# Patient Record
Sex: Female | Born: 1995 | Race: Black or African American | Hispanic: No | Marital: Single | Smoking: Never smoker
Health system: Southern US, Community
[De-identification: ages and names within clinical notes are randomized; demographics above are authoritative.]

## PROBLEM LIST (undated history)

## (undated) DIAGNOSIS — Z789 Other specified health status: Secondary | ICD-10-CM

## (undated) HISTORY — PX: NO PAST SURGERIES: SHX2092

---

## 2019-08-10 ENCOUNTER — Encounter (HOSPITAL_COMMUNITY): Payer: Self-pay | Admitting: Student

## 2019-08-10 ENCOUNTER — Emergency Department (HOSPITAL_COMMUNITY)
Admission: EM | Admit: 2019-08-10 | Discharge: 2019-08-10 | Disposition: A | Discharge status: Home / Self Care | Payer: PRIVATE HEALTH INSURANCE | Attending: Emergency Medicine | Admitting: Emergency Medicine

## 2019-08-10 ENCOUNTER — Emergency Department (HOSPITAL_COMMUNITY): Payer: PRIVATE HEALTH INSURANCE

## 2019-08-10 ENCOUNTER — Other Ambulatory Visit: Payer: Self-pay

## 2019-08-10 DIAGNOSIS — R109 Unspecified abdominal pain: Secondary | ICD-10-CM

## 2019-08-10 DIAGNOSIS — R102 Pelvic and perineal pain: Secondary | ICD-10-CM | POA: Insufficient documentation

## 2019-08-10 DIAGNOSIS — N899 Noninflammatory disorder of vagina, unspecified: Secondary | ICD-10-CM | POA: Insufficient documentation

## 2019-08-10 DIAGNOSIS — R197 Diarrhea, unspecified: Secondary | ICD-10-CM | POA: Diagnosis present

## 2019-08-10 DIAGNOSIS — Z3201 Encounter for pregnancy test, result positive: Secondary | ICD-10-CM | POA: Diagnosis not present

## 2019-08-10 DIAGNOSIS — Z3490 Encounter for supervision of normal pregnancy, unspecified, unspecified trimester: Secondary | ICD-10-CM

## 2019-08-10 LAB — COMPREHENSIVE METABOLIC PANEL
ALT: 12 U/L (ref 0–44)
AST: 18 U/L (ref 15–41)
Albumin: 4.2 g/dL (ref 3.5–5.0)
Alkaline Phosphatase: 64 U/L (ref 38–126)
Anion gap: 6 (ref 5–15)
BUN: 7 mg/dL (ref 6–20)
CO2: 27 mmol/L (ref 22–32)
Calcium: 8.9 mg/dL (ref 8.9–10.3)
Chloride: 104 mmol/L (ref 98–111)
Creatinine, Ser: 0.87 mg/dL (ref 0.44–1.00)
GFR calc Af Amer: >60 mL/min (ref >60)
GFR calc non Af Amer: >60 mL/min (ref >60)
Glucose, Bld: 123 mg/dL — ABNORMAL HIGH (ref 70–99)
Potassium: 3.9 mmol/L (ref 3.5–5.1)
Sodium: 137 mmol/L (ref 135–145)
Total Bilirubin: 0.9 mg/dL (ref 0.3–1.2)
Total Protein: 7.2 g/dL (ref 6.5–8.1)

## 2019-08-10 LAB — WET PREP, GENITAL
Clue Cells Wet Prep HPF POC: NONE SEEN
Sperm: NONE SEEN
Trich, Wet Prep: NONE SEEN

## 2019-08-10 LAB — URINALYSIS, ROUTINE W REFLEX MICROSCOPIC
Bilirubin Urine: NEGATIVE
Glucose, UA: NEGATIVE mg/dL
Hgb urine dipstick: NEGATIVE
Ketones, ur: NEGATIVE mg/dL
Nitrite: NEGATIVE
Protein, ur: NEGATIVE mg/dL
Specific Gravity, Urine: 1.016 (ref 1.005–1.030)
pH: 7 (ref 5.0–8.0)

## 2019-08-10 LAB — CBC
HCT: 35.6 % — ABNORMAL LOW (ref 36.0–46.0)
Hemoglobin: 11.1 g/dL — ABNORMAL LOW (ref 12.0–15.0)
MCH: 29.3 pg (ref 26.0–34.0)
MCHC: 31.2 g/dL (ref 30.0–36.0)
MCV: 93.9 fL (ref 80.0–100.0)
Platelets: 226 10*3/uL (ref 150–400)
RBC: 3.79 MIL/uL — ABNORMAL LOW (ref 3.87–5.11)
RDW: 13.8 % (ref 11.5–15.5)
WBC: 8.6 10*3/uL (ref 4.0–10.5)
nRBC: 0 % (ref 0.0–0.2)

## 2019-08-10 LAB — I-STAT BETA HCG BLOOD, ED (MC, WL, AP ONLY): I-stat hCG, quantitative: >2000 m[IU]/mL — ABNORMAL HIGH (ref <5)

## 2019-08-10 LAB — LIPASE, BLOOD: Lipase: 23 U/L (ref 11–51)

## 2019-08-10 LAB — HCG, QUANTITATIVE, PREGNANCY: hCG, Beta Chain, Quant, S: 4608 m[IU]/mL — ABNORMAL HIGH (ref <5)

## 2019-08-10 MED ORDER — CLOTRIMAZOLE 1 % VA CREA: 1.0000 | TOPICAL_CREAM | Freq: Every day | VAGINAL | 0 refills | Status: AC

## 2019-08-10 MED ORDER — SODIUM CHLORIDE 0.9% FLUSH: 3.0000 mL | Freq: Once | INTRAVENOUS | Status: DC

## 2019-08-10 NOTE — ED Triage Notes (Signed)
Pt here from home with c/o right lower abd pain along with diarrhea , pt states that it is usually worse after spicy foods ,

## 2019-08-10 NOTE — ED Provider Notes (Signed)
[facility]   CSN: [id number] Arrival date & time: 08/10/19  1033     History   Chief Complaint No chief complaint on file.   HPI Kendra Middleton is a 23 y.o. female with no significant past medical history who presents to the ED for an evaluation of diarrhea and abdominal cramping for the past week.  Patient admits to 2-3 episodes of non-bloody, watery diarrhea daily for the past week.  Patient notes diarrhea started 1 week ago after [facility] a [facility]. She is unsure when her last menstrual period was. She admits to irregular menses. Denies urination symptoms. Denies vaginal symptoms. She is sexually active with 1 partner without protection. Patient is not currently on any birth control. She is not concerned for any STIs at this time. Patient also notes her nipples have been sore for the past week. Patient denies nausea, vomiting, chest pain, shortness of breath, vaginal bleeding, and dysuria.   History reviewed. No pertinent past medical history.  There are no active problems to display for this patient.   History reviewed. No pertinent surgical history.   OB History   No obstetric history on file.      Home Medications    Prior to Admission medications   Medication Sig Start Date End Date Taking? Authorizing Provider  clotrimazole (GYNE-LOTRIMIN) 1 % vaginal cream Place 1 Applicatorful vaginally at bedtime for 7 days. 08/10/19 08/17/19  Renee Harderheek, Rhian Funari B, PA-C    Family History History reviewed. No pertinent family history.  Social History Social History   Tobacco Use  . Smoking status: Not on file  Substance Use Topics  . Alcohol use: Not on file  . Drug use: Not on file     Allergies   Patient has no known allergies.   Review of Systems Review of Systems  Constitutional: Negative for chills and fever.  Gastrointestinal: Positive for abdominal pain and diarrhea. Negative for nausea and vomiting.   Genitourinary: Positive for menstrual problem (irregular menstrual periods) and pelvic pain (cramping). Negative for difficulty urinating, dysuria, vaginal bleeding, vaginal discharge and vaginal pain.     Physical Exam Updated Vital Signs BP 136/67 (BP Location: Right Arm)   Pulse 91   Temp 99.1 F (37.3 C) (Oral)   Resp 18   SpO2 100%   Physical Exam Vitals signs and nursing note reviewed. Exam conducted with a chaperone present.  Constitutional:      General: She is not in acute distress.    Appearance: She is not ill-appearing.  HENT:     Head: Normocephalic.  Eyes:     Conjunctiva/sclera: Conjunctivae normal.  Neck:     Musculoskeletal: Normal range of motion and neck supple.  Cardiovascular:     Rate and Rhythm: Normal rate and regular rhythm.     Pulses: Normal pulses.     Heart sounds: Normal heart sounds. No murmur. No friction rub. No gallop.   Pulmonary:     Effort: Pulmonary effort is normal.     Breath sounds: Normal breath sounds.  Abdominal:     General: Abdomen is flat. Bowel sounds are normal. There is no distension.     Palpations: Abdomen is soft.     Tenderness: There is no abdominal tenderness. There is no guarding or rebound.     Comments: Abdomen soft, nondistended, nontender to palpation in all quadrants without guarding or peritoneal signs. No rebound.    Genitourinary:    Exam position: Supine.  Vagina: Vaginal discharge (white discharge) present. No tenderness or bleeding.     Cervix: Discharge present. No cervical motion tenderness, erythema or cervical bleeding.     Uterus: Normal.      Adnexa: Right adnexa normal and left adnexa normal.       Right: No tenderness.         Left: No tenderness.    Musculoskeletal:     Comments: Able to move all 4 extremities without difficulty. No lower extremity edema. Distal sensation and pulses intact bilaterally.  Lymphadenopathy:     Lower Body: No right inguinal adenopathy. No left inguinal  adenopathy.  Skin:    General: Skin is warm and dry.  Neurological:     General: No focal deficit present.     Mental Status: She is alert.      ED Treatments / Results  Labs (all labs ordered are listed, but only abnormal results are displayed) Labs Reviewed  WET PREP, GENITAL - Abnormal; Notable for the following components:      Result Value   Yeast Wet Prep HPF POC PRESENT (*)    WBC, Wet Prep HPF POC MODERATE (*)    All other components within normal limits  COMPREHENSIVE METABOLIC PANEL - Abnormal; Notable for the following components:   Glucose, Bld 123 (*)    All other components within normal limits  CBC - Abnormal; Notable for the following components:   RBC 3.79 (*)    Hemoglobin 11.1 (*)    HCT 35.6 (*)    All other components within normal limits  URINALYSIS, ROUTINE W REFLEX MICROSCOPIC - Abnormal; Notable for the following components:   APPearance HAZY (*)    Leukocytes,Ua SMALL (*)    Bacteria, UA RARE (*)    All other components within normal limits  HCG, QUANTITATIVE, PREGNANCY - Abnormal; Notable for the following components:   hCG, Beta Chain, Quant, S 4,608 (*)    All other components within normal limits  I-STAT BETA HCG BLOOD, ED ([facility], [facility], [facility] ONLY) - Abnormal; Notable for the following components:   I-stat hCG, quantitative >2,000.0 (*)    All other components within normal limits  LIPASE, BLOOD  GC/CHLAMYDIA PROBE AMP ([place]) NOT AT [facility]    EKG None  Radiology US Ob Less Than 14 Weeks With Ob Transvaginal  Result Date: 08/10/2019 CLINICAL DATA:  Abdominal cramping with positive beta HCG EXAM: OBSTETRIC <14 WK [place] AND TRANSVAGINAL OB US TECHNIQUE: Both transabdominal and transvaginal ultrasound examinations were performed for complete evaluation of the gestation as well as the maternal uterus, adnexal regions, and pelvic cul-de-sac. Transvaginal technique was performed to assess early pregnancy. COMPARISON:  None. FINDINGS: Intrauterine  gestational sac: Visualized Yolk sac:  Not visualized Embryo:  Not visualized Cardiac Activity: Not visualized MSD: 8 mm   5 w   4 d Subchorionic hemorrhage:  None visualized. Maternal uterus/adnexae: There is an apparent hemorrhagic corpus luteum on the right, measuring 2.1 x 1.8 x 1.7 cm. No other extrauterine pelvic mass. A small amount of free fluid is noted in the cul-de-sac region. IMPRESSION: 1. Probable early intrauterine gestational sac, but no yolk sac, fetal pole, or cardiac activity yet visualized. Recommend follow-up quantitative B-HCG levels and follow-up US in 14 days to assess viability. This recommendation follows SRU consensus guidelines: Diagnostic Criteria for Nonviable Pregnancy Early in the First Trimester. [employer] Med 2013; 409:7353-29. Based on apparent gestational sac size, estimated gestational age is 5+ weeks. 2. Apparent hemorrhagic corpus  luteum on the right. Particular attention this area on subsequent examinations advised. 3. Small amount of free pelvic fluid which may be due to recent leakage from corpus luteum. Electronically Signed   By: Bretta Bang III M.D.   On: 08/10/2019 14:13    Procedures Procedures (including critical care time)  Medications Ordered in ED Medications  sodium chloride flush (NS) 0.9 % injection 3 mL (has no administration in time range)     Initial Impression / Assessment and Plan / ED Course  I have reviewed the triage vital signs and the nursing notes.  Pertinent labs & imaging results that were available during my care of the patient were reviewed by me and considered in my medical decision making (see chart for details).  Clinical Course as of Aug 10 1703  Tue Aug 10, 2019  1439 Spoke to on call OBGYN about the hemorrhagic corpus luteum who notes it can cause pain, but no emergent treatment needed. She recommends repeat quant in 48 hours. If quant is rising appropriately, repeat US in 14 days. If it is not rising appropriately,  repeat US then   [CC]    Clinical Course User Index [CC] Cheek, Vesta Mixer, PA-C      23 year old female presents to the ED for an evaluation of diarrhea, abdominal cramps and nipple irritation for the past week.  Patient is unsure when her last menstrual period was due to irregular menses.  Patient is afebrile, not tachycardic or hypoxic.  Patient is in no acute distress and rather well-appearing.  Abdomen soft, nondistended, nontender.  No peritoneal signs. Low suspicion for appendicitis, cholecystitis, bowel obstruction or perforation at this time. No CVA tenderness bilaterally.  Initial i-STAT beta hcG >2,000 will order quantitative hcg.  UA reassuring with small amounts of leukocytes but rare bacteria likely due to contamination. CBC with no leukocytosis. Hgb at 11.1, no previous records to compare baseline. CMP reassuring. Suspect patient's symptoms are related to pregnancy. Will obtain US to rule out ectopic and to ensure it is a intrauterine pregnancy.   [place] personally reviewed which shows possible early intrauterine pregnancy, but too early to tell. Spoke to [employer]- see note above. Patient notes after transvaginal US, she noticed yellow discharge. Will perform pelvic with GC/Ch and wet prep. No CMT. No concern for PID. Wet prep positive for yeast. Will treat with topical cream given patient is pregnant. Advised patient to have repeat hcg in 48 hours and repeat US in 14 days if hcg continues to rise as expected. Patient has been advised to start a prenatal vitamin and call an OBGYN to schedule an appointment once she returns home. Patient does not want something for diarrhea at this time due to her positive pregnancy status. Strict ED precautions discussed with patient. Patient states understanding and agrees to plan. Patient discharged home in no acute distress and vitals within normal limits.   Final Clinical Impressions(s) / ED Diagnoses   Final diagnoses:  Pregnancy at early stage   Diarrhea, unspecified type    ED Discharge Orders         Ordered    clotrimazole (GYNE-LOTRIMIN) 1 % vaginal cream  Daily at bedtime     08/10/19 [address] 08/10/19 1704    Jacalyn Lefevre, MD 08/11/19 [phone]

## 2019-08-10 NOTE — ED Notes (Signed)
Patient transported to Ultrasound 

## 2019-08-10 NOTE — ED Notes (Signed)
Discharge instructions and prescription discussed with Pt. Pt verbalized understanding. Pt stable and ambulatory.    

## 2019-08-10 NOTE — ED Notes (Signed)
Spoke with main lab and they are processing the HCG quantitative.

## 2019-08-10 NOTE — Discharge Instructions (Addendum)
As discussed, you will need a repeat quantitative hcg in 48. Your value today was 4608. If your hcg raises as expected in 48 hours you do not need another ultrasound for 14 days. If your level does not rise as it is suppose to get a repeat ultrasound. I am prescribing you a vaginal cream for your yeast infection. Take as prescribed. Prenatal vitamins are over the counter, so you can just grab one once you fill your prescription. Please call to schedule an appointment with an OBGYN within the next week for further evaluation. Return to the ER for new or worsening symptoms.

## 2019-08-12 ENCOUNTER — Emergency Department (HOSPITAL_COMMUNITY)
Admission: EM | Admit: 2019-08-12 | Discharge: 2019-08-12 | Disposition: A | Discharge status: Home / Self Care | Payer: PRIVATE HEALTH INSURANCE | Attending: Emergency Medicine | Admitting: Emergency Medicine

## 2019-08-12 DIAGNOSIS — O209 Hemorrhage in early pregnancy, unspecified: Secondary | ICD-10-CM | POA: Insufficient documentation

## 2019-08-12 DIAGNOSIS — Z3A00 Weeks of gestation of pregnancy not specified: Secondary | ICD-10-CM | POA: Diagnosis not present

## 2019-08-12 DIAGNOSIS — Z3A01 Less than 8 weeks gestation of pregnancy: Secondary | ICD-10-CM

## 2019-08-12 LAB — HCG, QUANTITATIVE, PREGNANCY: hCG, Beta Chain, Quant, S: 8701 m[IU]/mL — ABNORMAL HIGH (ref <5)

## 2019-08-12 LAB — I-STAT BETA HCG BLOOD, ED (MC, WL, AP ONLY): I-stat hCG, quantitative: >2000 m[IU]/mL — ABNORMAL HIGH (ref <5)

## 2019-08-12 NOTE — ED Notes (Signed)
ED Provider at bedside. 

## 2019-08-12 NOTE — Discharge Instructions (Addendum)
Your blood test shows your pregnancy is progressing.   Schedule appointment for prenatal care.  Your current hormone level is 8701.

## 2019-08-12 NOTE — ED Triage Notes (Signed)
Pt states she has arrived here for a repeat hcg as instructed by the doctor  to return in 48 hours for a repeat hcg.

## 2019-08-12 NOTE — ED Notes (Signed)
Pt asked whether she would be getting results soon, verified with lab that results are in process, updated pt

## 2019-08-12 NOTE — ED Provider Notes (Signed)
[facility] EMERGENCY DEPARTMENT Provider Note   CSN: [id number] Arrival date & time: 08/12/19  1006     History   Chief Complaint Chief Complaint  Patient presents with   Possible Pregnancy    HPI Kendra Middleton is a 23 y.o. female.     Pt was seen here 2 days ago for early pregnancy and spotting.  Patient was told to return for repeat quantitative hCG patient had an ultrasound which showed a gestational sac.  Patient reports that she has had a small amount of pink spotting.  Patient denies any bleeding she has not had any cramping she denies any pain  The history is provided by the patient. No language interpreter was used.  Possible Pregnancy This is a new problem. Nothing aggravates the symptoms. Nothing relieves the symptoms. She has tried nothing for the symptoms. The treatment provided no relief.    No past medical history on file.  There are no active problems to display for this patient.   No past surgical history on file.   OB History   No obstetric history on file.      Home Medications    Prior to Admission medications   Medication Sig Start Date End Date Taking? Authorizing Provider  clotrimazole (GYNE-LOTRIMIN) 1 % vaginal cream Place 1 Applicatorful vaginally at bedtime for 7 days. 08/10/19 08/17/19  Jonette Eva, PA-C    Family History No family history on file.  Social History Social History   Tobacco Use   Smoking status: Not on file  Substance Use Topics   Alcohol use: Not on file   Drug use: Not on file     Allergies   Patient has no known allergies.   Review of Systems Review of Systems  All other systems reviewed and are negative.    Physical Exam Updated Vital Signs BP (!) 127/93    Pulse 82    Temp 98 F (36.7 C) (Oral)    Resp 16    SpO2 99%   Physical Exam Vitals signs and nursing note reviewed.  Constitutional:      Appearance: Normal appearance.  Neck:     Musculoskeletal: Normal  range of motion.  Cardiovascular:     Rate and Rhythm: Normal rate.     Pulses: Normal pulses.  Pulmonary:     Effort: Pulmonary effort is normal.  Abdominal:     General: Abdomen is flat.  Musculoskeletal: Normal range of motion.  Skin:    General: Skin is warm.  Neurological:     General: No focal deficit present.     Mental Status: She is alert.  Psychiatric:        Mood and Affect: Mood normal.      ED Treatments / Results  Labs (all labs ordered are listed, but only abnormal results are displayed) Labs Reviewed  HCG, QUANTITATIVE, PREGNANCY - Abnormal; Notable for the following components:      Result Value   hCG, Beta Chain, Quant, S 8,701 (*)    All other components within normal limits  I-STAT BETA HCG BLOOD, ED ([facility], [facility], [facility] ONLY) - Abnormal; Notable for the following components:   I-stat hCG, quantitative >2,000.0 (*)    All other components within normal limits    EKG None  Radiology US Ob Less Than 14 Weeks With Ob Transvaginal  Result Date: 08/10/2019 CLINICAL DATA:  Abdominal cramping with positive beta HCG EXAM: OBSTETRIC <14 WK [place] AND TRANSVAGINAL OB US TECHNIQUE: Both  transabdominal and transvaginal ultrasound examinations were performed for complete evaluation of the gestation as well as the maternal uterus, adnexal regions, and pelvic cul-de-sac. Transvaginal technique was performed to assess early pregnancy. COMPARISON:  None. FINDINGS: Intrauterine gestational sac: Visualized Yolk sac:  Not visualized Embryo:  Not visualized Cardiac Activity: Not visualized MSD: 8 mm   5 w   4 d Subchorionic hemorrhage:  None visualized. Maternal uterus/adnexae: There is an apparent hemorrhagic corpus luteum on the right, measuring 2.1 x 1.8 x 1.7 cm. No other extrauterine pelvic mass. A small amount of free fluid is noted in the cul-de-sac region. IMPRESSION: 1. Probable early intrauterine gestational sac, but no yolk sac, fetal pole, or cardiac activity yet visualized.  Recommend follow-up quantitative B-HCG levels and follow-up US in 14 days to assess viability. This recommendation follows SRU consensus guidelines: Diagnostic Criteria for Nonviable Pregnancy Early in the First Trimester. Malva Limes Med 2013; 109:3235-57. Based on apparent gestational sac size, estimated gestational age is 5+ weeks. 2. Apparent hemorrhagic corpus luteum on the right. Particular attention this area on subsequent examinations advised. 3. Small amount of free pelvic fluid which may be due to recent leakage from corpus luteum. Electronically Signed   By: Bretta Bang III M.D.   On: 08/10/2019 14:13    Procedures Procedures (including critical care time)  Medications Ordered in ED Medications - No data to display   Initial Impression / Assessment and Plan / ED Course  I have reviewed the triage vital signs and the nursing notes.  Pertinent labs & imaging results that were available during my care of the patient were reviewed by me and considered in my medical decision making (see chart for details).        Medical decision making: Ultrasound reviewed and discussed with patient I explained to patient that she is very early.  Patient's quantitative hCG has doubled in 48 hours.  Is consistent with a developing pregnancy.  Patient is advised to establish prenatal care she is advised to [facility] if any further complications. An After Visit Summary was printed and given to the patient.   Final Clinical Impressions(s) / ED Diagnoses   Final diagnoses:  Less than [redacted] weeks gestation of pregnancy    ED Discharge Orders    None       Osie Cheeks 08/12/19 1337    Gerhard Munch, MD 08/13/19 [phone]

## 2019-08-13 LAB — GC/CHLAMYDIA PROBE AMP (~~LOC~~) NOT AT ARMC
Chlamydia: NEGATIVE
Neisseria Gonorrhea: NEGATIVE

## 2019-08-19 ENCOUNTER — Encounter (HOSPITAL_COMMUNITY): Payer: Self-pay | Admitting: Student

## 2019-08-19 ENCOUNTER — Inpatient Hospital Stay (HOSPITAL_COMMUNITY)
Admission: AD | Admit: 2019-08-19 | Discharge: 2019-08-19 | Disposition: A | Discharge status: Home / Self Care | Payer: PRIVATE HEALTH INSURANCE | Attending: Obstetrics & Gynecology | Admitting: Obstetrics & Gynecology

## 2019-08-19 ENCOUNTER — Other Ambulatory Visit: Payer: Self-pay

## 2019-08-19 DIAGNOSIS — R109 Unspecified abdominal pain: Secondary | ICD-10-CM | POA: Insufficient documentation

## 2019-08-19 DIAGNOSIS — O209 Hemorrhage in early pregnancy, unspecified: Secondary | ICD-10-CM | POA: Diagnosis not present

## 2019-08-19 DIAGNOSIS — O26891 Other specified pregnancy related conditions, first trimester: Secondary | ICD-10-CM | POA: Insufficient documentation

## 2019-08-19 DIAGNOSIS — M549 Dorsalgia, unspecified: Secondary | ICD-10-CM | POA: Diagnosis not present

## 2019-08-19 DIAGNOSIS — Z3A01 Less than 8 weeks gestation of pregnancy: Secondary | ICD-10-CM

## 2019-08-19 DIAGNOSIS — N939 Abnormal uterine and vaginal bleeding, unspecified: Secondary | ICD-10-CM | POA: Diagnosis present

## 2019-08-19 DIAGNOSIS — O99891 Other specified diseases and conditions complicating pregnancy: Secondary | ICD-10-CM | POA: Diagnosis not present

## 2019-08-19 DIAGNOSIS — Z3A00 Weeks of gestation of pregnancy not specified: Secondary | ICD-10-CM | POA: Diagnosis not present

## 2019-08-19 HISTORY — DX: Other specified health status: Z78.9

## 2019-08-19 LAB — URINALYSIS, ROUTINE W REFLEX MICROSCOPIC
Bilirubin Urine: NEGATIVE
Glucose, UA: NEGATIVE mg/dL
Hgb urine dipstick: NEGATIVE
Ketones, ur: NEGATIVE mg/dL
Nitrite: NEGATIVE
Protein, ur: NEGATIVE mg/dL
Specific Gravity, Urine: 1.016 (ref 1.005–1.030)
pH: 8 (ref 5.0–8.0)

## 2019-08-19 NOTE — MAU Note (Addendum)
2 days ago, started having pink d/c.  Been having really bad pain in low back and abd.  Went to hosp here, thought she had food poisoning, found out she was preg.  Went to ER yesterday because of d/c... they tried to get heart tones, couldn't find heartbeat, was told she might be having a miscarriage.  Was also seen in [facility] yesterday, had another [place]

## 2019-08-19 NOTE — MAU Provider Note (Signed)
Chief Complaint: Vaginal Bleeding, Abdominal Pain, and Back Pain   First Provider Initiated Contact with Patient 08/19/19 1348     SUBJECTIVE HPI: Kendra Middleton is a 23 y.o. G1P0 at Unknown gestational age who presents to Maternity Admissions reporting abdominal pain, back pain, & pink spotting. Patient lives in [place] but drives to [place] every week with her significant other. Was seen at a hospital in [place] yesterday for same issue.. Reports was given information about miscarriage.  Reports lower abdominal cramping & pink spotting. Pink spotting on toilet paper when she wipes for the last 2 days. Not bleeding into a pad or passing clots. No recent intercourse.     Past Medical History:  Diagnosis Date  . Medical history non-contributory    OB History  Gravida Para Term Preterm AB Living  1            SAB TAB Ectopic Multiple Live Births               # Outcome Date GA Lbr Len/2nd Weight Sex Delivery Anes PTL Lv  1 Current            Past Surgical History:  Procedure Laterality Date  . NO PAST SURGERIES     Social History   Socioeconomic History  . Marital status: Single    Spouse name: Not on file  . Number of children: Not on file  . Years of education: Not on file  . Highest education level: Not on file  Occupational History  . Not on file  Social Needs  . Financial resource strain: Not on file  . Food insecurity    Worry: Not on file    Inability: Not on file  . Transportation needs    Medical: Not on file    Non-medical: Not on file  Tobacco Use  . Smoking status: Never Smoker  Substance and Sexual Activity  . Alcohol use: Not Currently  . Drug use: Not Currently  . Sexual activity: Not on file  Lifestyle  . Physical activity    Days per week: Not on file    Minutes per session: Not on file  . Stress: Not on file  Relationships  . Social Herbalist on phone: Not on file    Gets together: Not on file    Attends religious service: Not  on file    Active member of club or organization: Not on file    Attends meetings of clubs or organizations: Not on file    Relationship status: Not on file  . Intimate partner violence    Fear of current or ex partner: Not on file    Emotionally abused: Not on file    Physically abused: Not on file    Forced sexual activity: Not on file  Other Topics Concern  . Not on file  Social History Narrative  . Not on file   No family history on file. No current facility-administered medications on file prior to encounter.    No current outpatient medications on file prior to encounter.   No Known Allergies  I have reviewed patient's Past Medical Hx, Surgical Hx, Family Hx, Social Hx, medications and allergies.   Review of Systems  Constitutional: Negative.   Gastrointestinal: Positive for abdominal pain.  Genitourinary: Positive for vaginal bleeding.  Musculoskeletal: Positive for back pain.    OBJECTIVE Patient Vitals for the past 24 hrs:  BP Temp Temp src Pulse Resp SpO2 Height Weight  08/19/19 1420 - - - - 18 - - -  08/19/19 1320 123/70 98.2 F (36.8 C) Oral 89 16 100 % 5\' 9"  (1.753 m) 98.8 kg   Constitutional: Well-developed, well-nourished female in no acute distress.  Cardiovascular: normal rate & rhythm, no murmur Respiratory: normal rate and effort. Lung sounds clear throughout GI: Abd soft, non-tender, Pos BS x 4. No guarding or rebound tenderness MS: Extremities nontender, no edema, normal ROM Neurologic: Alert and oriented x 4.      LAB RESULTS Results for orders placed or performed during the hospital encounter of 08/19/19 (from the past 24 hour(s))  Urinalysis, Routine w reflex microscopic     Status: Abnormal   Collection Time: 08/19/19  2:45 PM  Result Value Ref Range   Color, Urine YELLOW YELLOW   APPearance CLEAR CLEAR   Specific Gravity, Urine 1.016 1.005 - 1.030   pH 8.0 5.0 - 8.0   Glucose, UA NEGATIVE NEGATIVE mg/dL   Hgb urine dipstick NEGATIVE  NEGATIVE   Bilirubin Urine NEGATIVE NEGATIVE   Ketones, ur NEGATIVE NEGATIVE mg/dL   Protein, ur NEGATIVE NEGATIVE mg/dL   Nitrite NEGATIVE NEGATIVE   Leukocytes,Ua TRACE (A) NEGATIVE   RBC / HPF 0-5 0 - 5 RBC/hpf   WBC, UA 11-20 0 - 5 WBC/hpf   Bacteria, UA FEW (A) NONE SEEN   Squamous Epithelial / LPF 0-5 0 - 5   Mucus PRESENT     IMAGING No results found.  MAU COURSE Orders Placed This Encounter  Procedures  . F/u ultrasound in clinic  . Urinalysis, Routine w reflex microscopic  . Discharge patient   No orders of the defined types were placed in this encounter.   MDM Reviewed patient's records via care everywhere & paperwork provided by patient. Was given educational information regarding threatened miscarriage after her ED visit in 14/03/20 yesterday. Ultrasound yesterday showed IUGS with yolk sac. Discussed option for repeat ultrasound today vs outpatient in a week but likely would not see any growth with an ultrasound today. Patient & her SO would like to have a follow up ultrasound next week.   RH positive per care everywhere   ASSESSMENT 1. Vaginal bleeding in pregnancy, first trimester     PLAN Discharge home in stable condition. SAB precautions Outpatient f/u ultrasound for viability ordered   Follow-up Information    [facility].   Specialty: Radiology Why: call to schedule your follow up ultrasound Contact information: [address] [place] [address], [address] [place] [facility] [phone]         Allergies as of 08/19/2019   No Known Allergies     Medication List    You have not been prescribed any medications.      14/11/2018, NP 08/19/2019  3:57 PM

## 2019-08-19 NOTE — Discharge Instructions (Signed)
Vaginal Bleeding During Pregnancy, First Trimester ° °A small amount of bleeding from the vagina (spotting) is relatively common during early pregnancy. It usually stops on its own. Various things may cause bleeding or spotting during early pregnancy. Some bleeding may be related to the pregnancy, and some may not. In many cases, the bleeding is normal and is not a problem. However, bleeding can also be a sign of something serious. Be sure to tell your health care provider about any vaginal bleeding right away. °Some possible causes of vaginal bleeding during the first trimester include: °· Infection or inflammation of the cervix. °· Growths (polyps) on the cervix. °· Miscarriage or threatened miscarriage. °· Pregnancy tissue developing outside of the uterus (ectopic pregnancy). °· A mass of tissue developing in the uterus due to an egg being fertilized incorrectly (molar pregnancy). °Follow these instructions at home: °Activity °· Follow instructions from your health care provider about limiting your activity. Ask what activities are safe for you. °· If needed, make plans for someone to help with your regular activities. °· Do not have sex or orgasms until your health care provider says that this is safe. °General instructions °· Take over-the-counter and prescription medicines only as told by your health care provider. °· Pay attention to any changes in your symptoms. °· Do not use tampons or douche. °· Write down how many pads you use each day, how often you change pads, and how soaked (saturated) they are. °· If you pass any tissue from your vagina, save the tissue so you can show it to your health care provider. °· Keep all follow-up visits as told by your health care provider. This is important. °Contact a health care provider if: °· You have vaginal bleeding during any part of your pregnancy. °· You have cramps or labor pains. °· You have a fever. °Get help right away if: °· You have severe cramps in your  back or abdomen. °· You pass large clots or a large amount of tissue from your vagina. °· Your bleeding increases. °· You feel light-headed or weak, or you faint. °· You have chills. °· You are leaking fluid or have a gush of fluid from your vagina. °Summary °· A small amount of bleeding (spotting) from the vagina is relatively common during early pregnancy. °· Various things may cause bleeding or spotting in early pregnancy. °· Be sure to tell your health care provider about any vaginal bleeding right away. °This information is not intended to replace advice given to you by your health care provider. Make sure you discuss any questions you have with your health care provider. °Document Released: 06/12/2005 Document Revised: 12/22/2018 Document Reviewed: 12/05/2016 °Elsevier Patient Education © 2020 [employer] ° °

## 2019-08-24 ENCOUNTER — Encounter: Payer: Self-pay | Admitting: Family Medicine

## 2019-08-24 ENCOUNTER — Ambulatory Visit (HOSPITAL_COMMUNITY)
Admission: RE | Admit: 2019-08-24 | Discharge: 2019-08-24 | Disposition: A | Discharge status: Home / Self Care | Payer: PRIVATE HEALTH INSURANCE | Source: Ambulatory Visit | Attending: Student | Admitting: Student

## 2019-08-24 ENCOUNTER — Encounter: Payer: Self-pay | Admitting: Student

## 2019-08-24 ENCOUNTER — Other Ambulatory Visit (HOSPITAL_COMMUNITY): Payer: Self-pay | Admitting: Student

## 2019-08-24 ENCOUNTER — Other Ambulatory Visit: Payer: Self-pay

## 2019-08-24 ENCOUNTER — Ambulatory Visit (INDEPENDENT_AMBULATORY_CARE_PROVIDER_SITE_OTHER): Payer: PRIVATE HEALTH INSURANCE

## 2019-08-24 DIAGNOSIS — O209 Hemorrhage in early pregnancy, unspecified: Secondary | ICD-10-CM | POA: Diagnosis present

## 2019-08-24 DIAGNOSIS — Z712 Person consulting for explanation of examination or test findings: Secondary | ICD-10-CM

## 2019-08-24 NOTE — Progress Notes (Signed)
Pt here today following [place] for viability. Results reviewed with Ardean Larsen, CNM who consulted Constant, MD. Ardean Larsen states that this is a viable pregnancy. Ardean Larsen recommends pt have a follow-up US prior to new OB appt if pt establishes care with our office.  Pt given [place] results and new dating information reviewed; pt is 6w 1d with EDD 04/17/20 based on today's [place]. Pt states she is currently living in [place] and will receive prenatal care there. Explained to patient that we will provide a proof of pregnancy letter today and she should contact the office where she would like to begin prenatal care as soon as possible so that they can schedule appts.   Pt is not currently taking any medications. List of medications safe to take during pregnancy given. Pt verbalizes understanding that she should return to [facility], or another ED if out of town, if she experiences severe abdominal pain or bleeding like she is having a period.   Apolonio Schneiders RN 08/24/19

## 2019-08-26 ENCOUNTER — Ambulatory Visit (HOSPITAL_COMMUNITY): Payer: Self-pay

## 2019-09-02 NOTE — Progress Notes (Signed)
Chart reviewed for nurse visit. Agree with plan of care.   [place], [place] 09/02/2019 3:33 PM

## 2020-02-17 IMAGING — US US OB < 14 WEEKS - US OB TV
1 series · 13 of 28 positions shown · non-contrast
Comparison: None.

CLINICAL DATA: Abdominal cramping with positive beta HCG

EXAM:
OBSTETRIC <14 WK US AND TRANSVAGINAL OB US
TECHNIQUE: Both transabdominal and transvaginal ultrasound examinations were
performed for complete evaluation of the gestation as well as the
maternal uterus, adnexal regions, and pelvic cul-de-sac.
Transvaginal technique was performed to assess early pregnancy.

[Series 1: us ob < 14 weeks - us ob tv · 80 acquisitions, 13 frames shown]
[im 3/80]
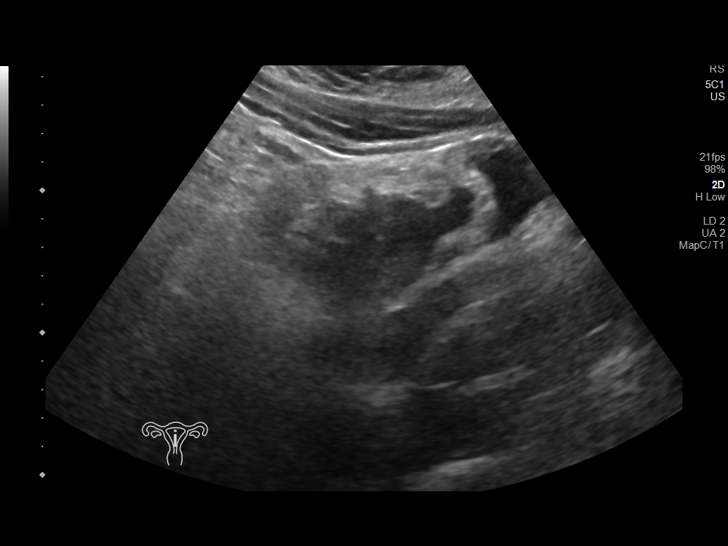
[im 9/80]
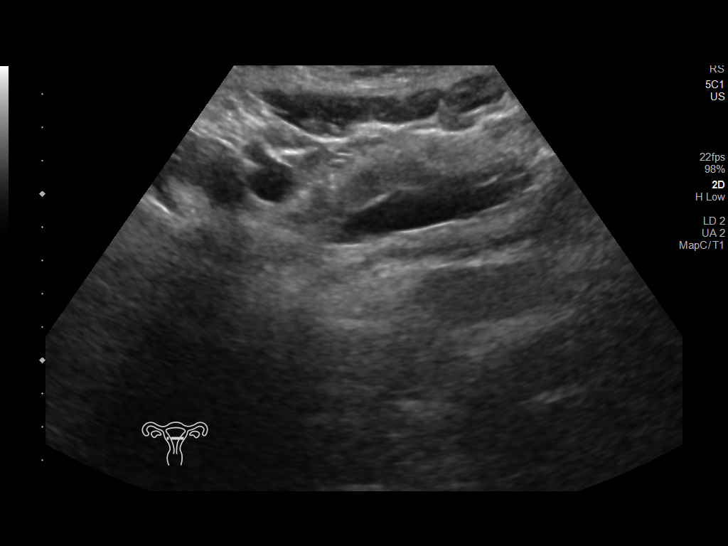
[im 15/80]
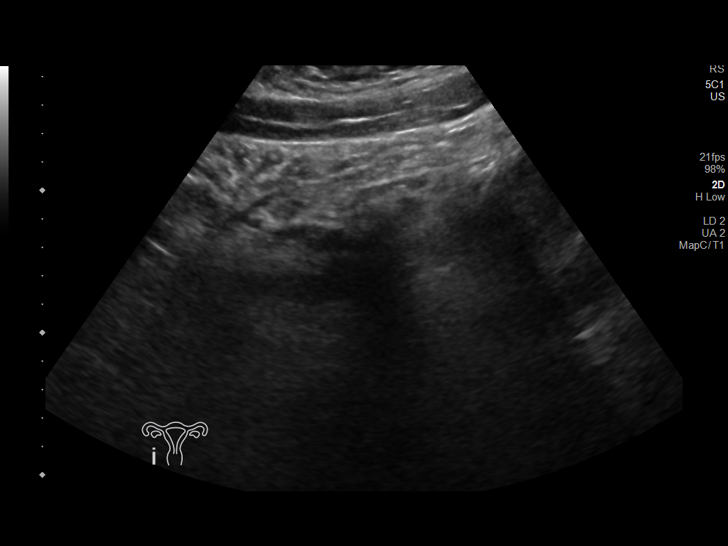
[im 21/80]
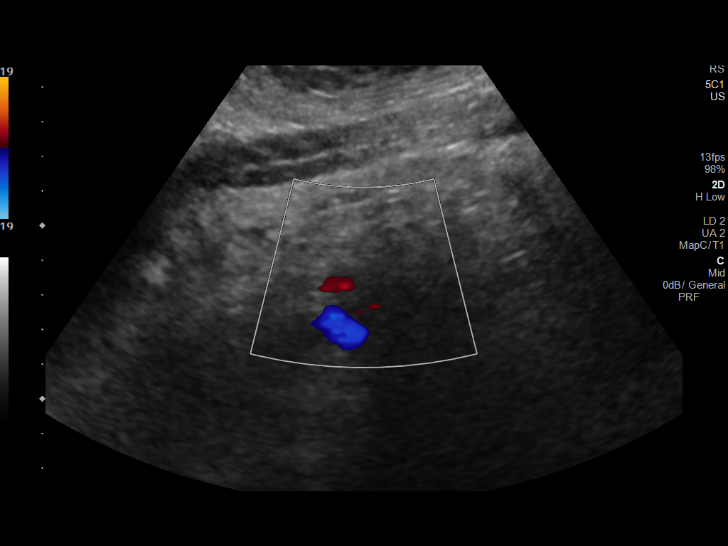
[im 27/80]
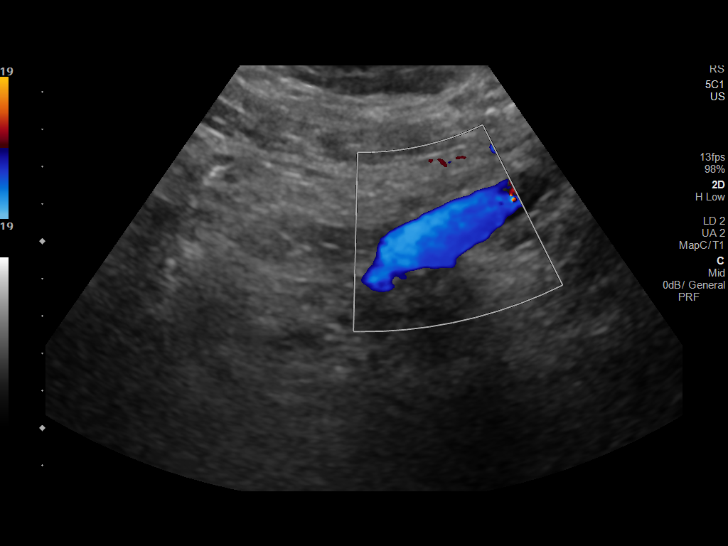
[im 33/80]
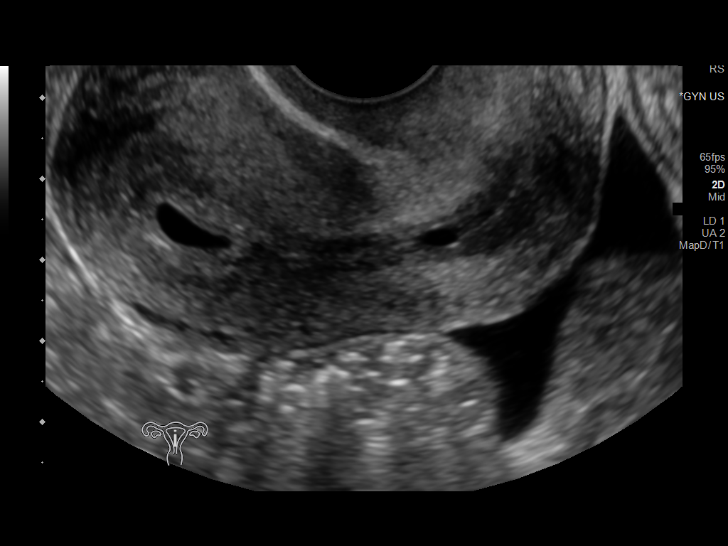
[im 41/80]
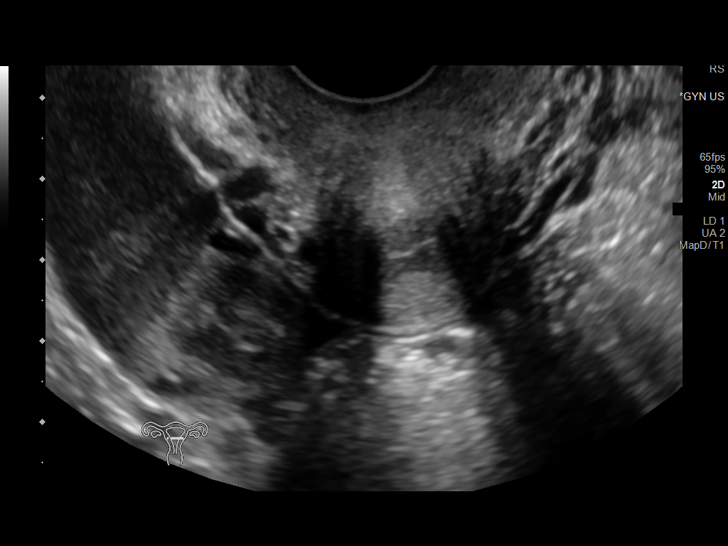
[im 47/80]
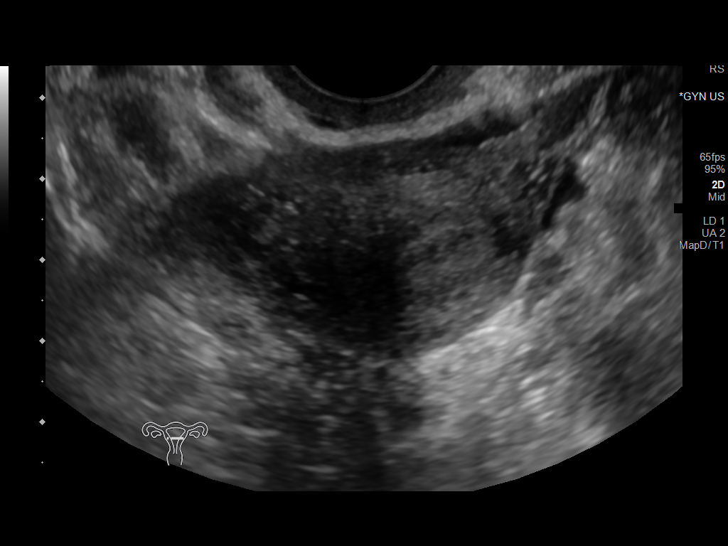
[im 53/80]
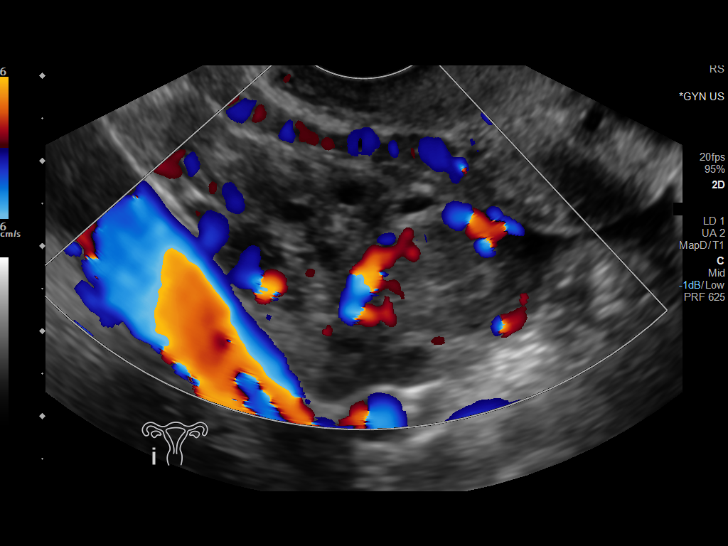
[im 59/80]
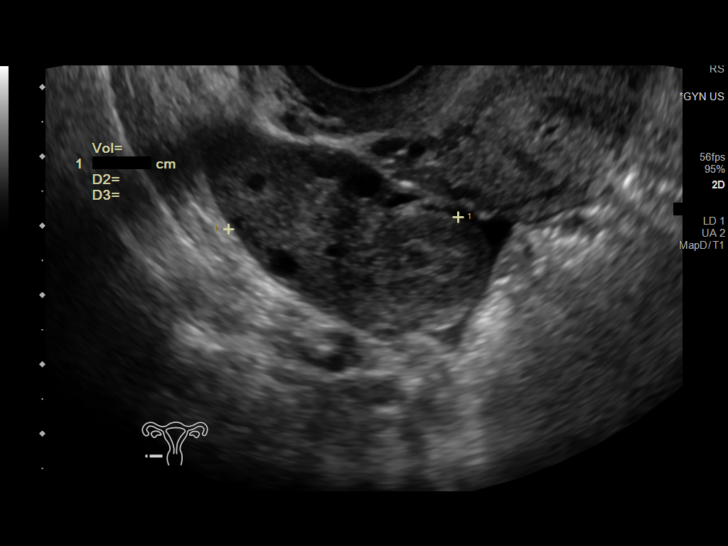
[im 65/80]
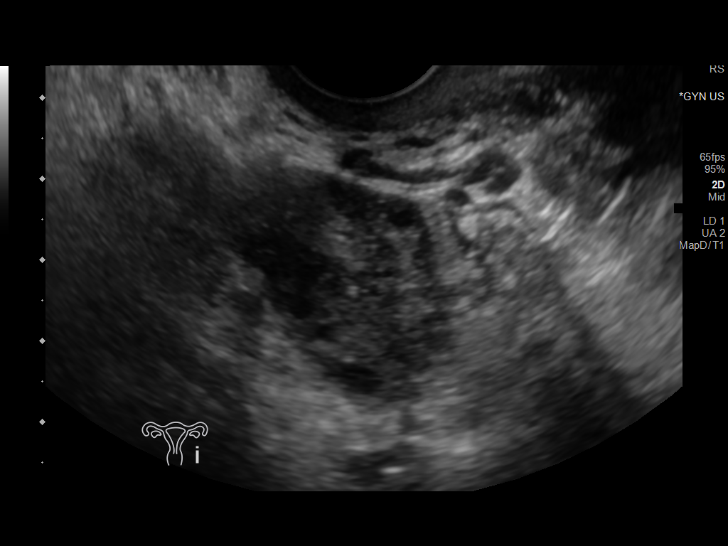
[im 71/80]
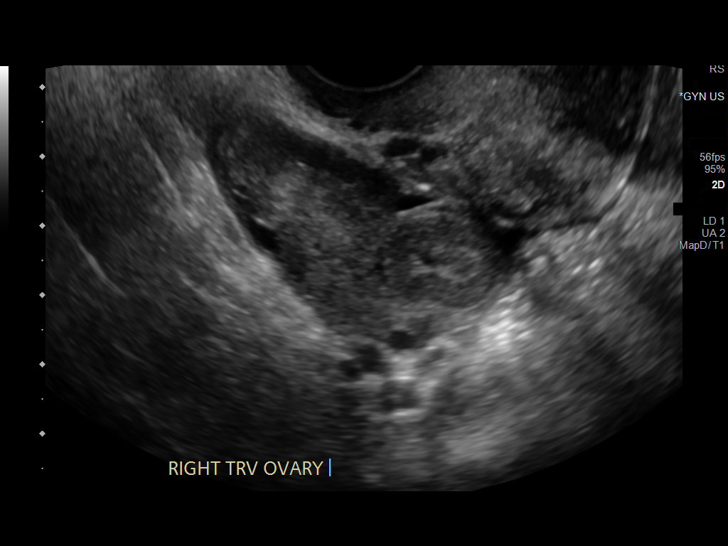
[im 77/80]
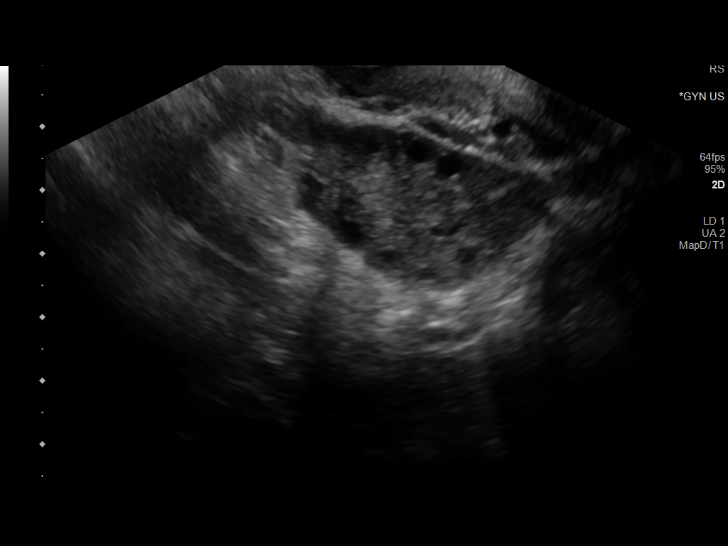

[13 of 28 positions shown; findings below may reference images not displayed]

FINDINGS: Intrauterine gestational sac: Visualized

Yolk sac:  Not visualized

Embryo:  Not visualized

Cardiac Activity: Not visualized

MSD: 8 mm   5 w   4 d

Subchorionic hemorrhage:  None visualized.

Maternal uterus/adnexae: There is an apparent hemorrhagic corpus
luteum on the right, measuring 2.1 x 1.8 x 1.7 cm. No other
extrauterine pelvic mass. A small amount of free fluid is noted in
the cul-de-sac region.
IMPRESSION: 1. Probable early intrauterine gestational sac, but no yolk sac,
fetal pole, or cardiac activity yet visualized. Recommend follow-up
quantitative B-HCG levels and follow-up US in 14 days to assess
viability. This recommendation follows SRU consensus guidelines:
Diagnostic Criteria for Nonviable Pregnancy Early in the First
Trimester. N Engl J Med 9303; [DATE]. Based on apparent
gestational sac size, estimated gestational age is 5+ weeks.

2. Apparent hemorrhagic corpus luteum on the right. Particular
attention this area on subsequent examinations advised.

3. Small amount of free pelvic fluid which may be due to recent
leakage from corpus luteum.

## 2020-03-02 IMAGING — US US OB TRANSVAGINAL
1 series · 15 of 28 positions shown · non-contrast
Comparison: Obstetrical ultrasound 08/10/2019 fall

CLINICAL DATA: Pregnancy viability, vaginal bleeding

EXAM:
TRANSVAGINAL OB ULTRASOUND
TECHNIQUE: Transvaginal ultrasound was performed for complete evaluation of the
gestation as well as the maternal uterus, adnexal regions, and
pelvic cul-de-sac.

[Series 1: us ob transvaginal · 15 of 46 slices shown]
[im 1/46]
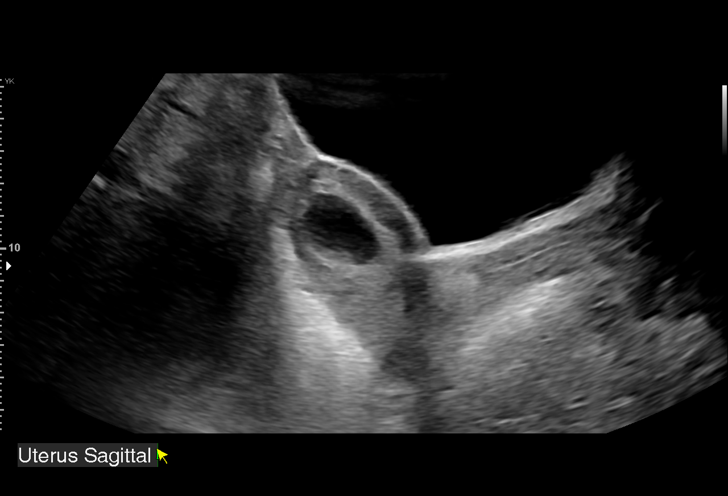
[im 4/46]
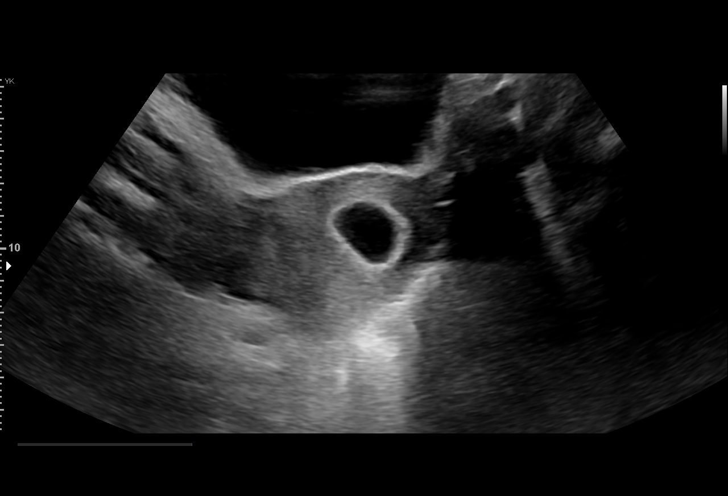
[im 7/46]
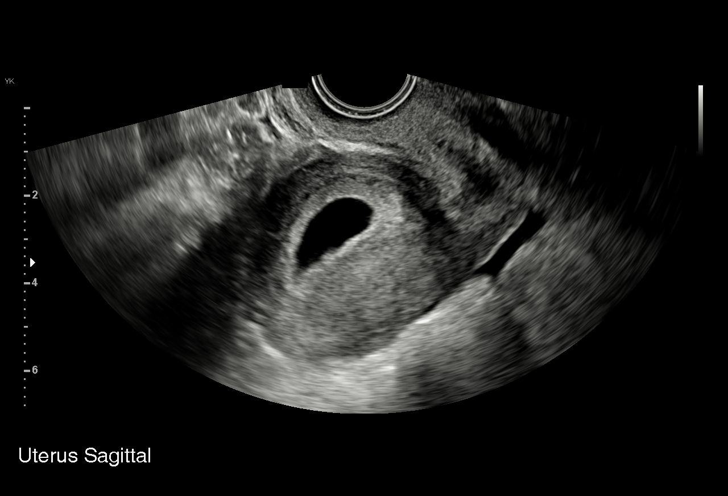
[im 11/46]
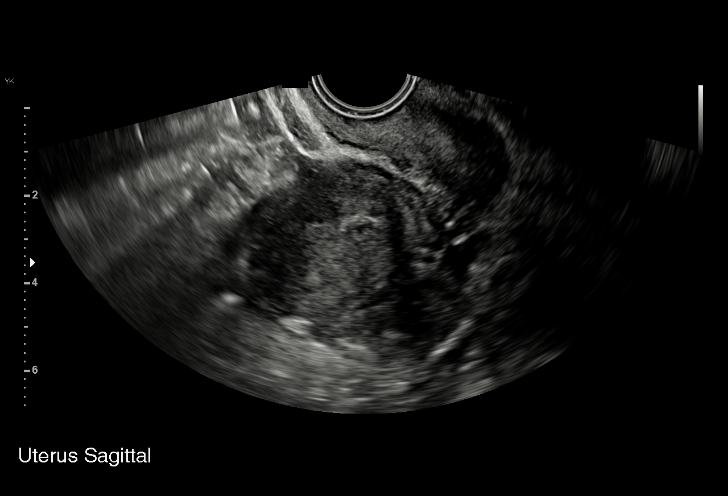
[im 14/46]
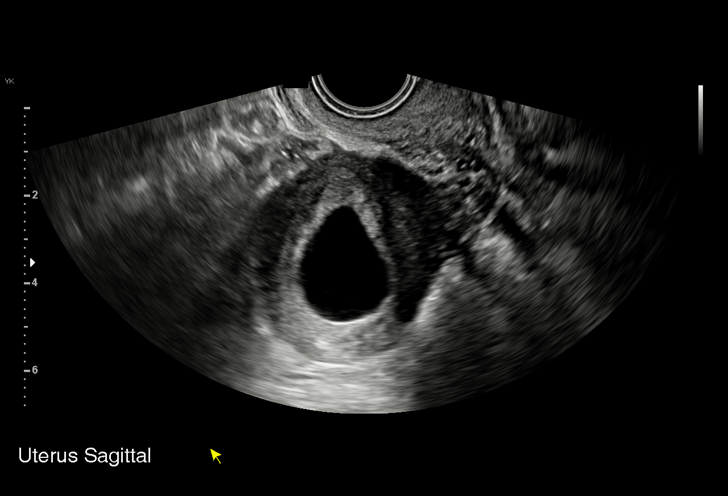
[im 17/46]
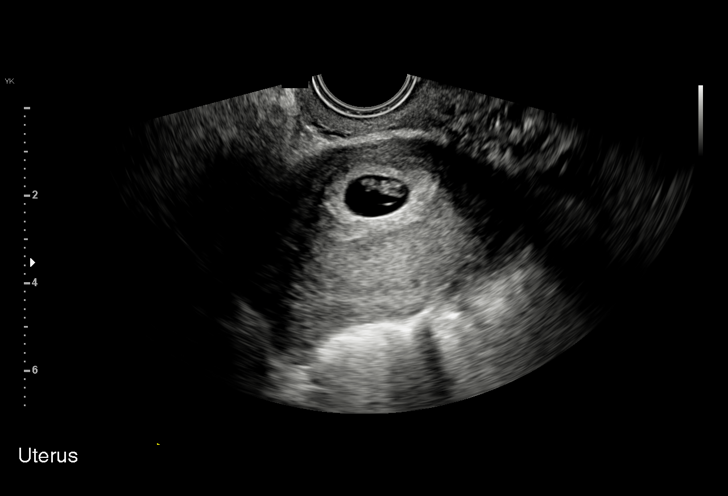
[im 21/46]
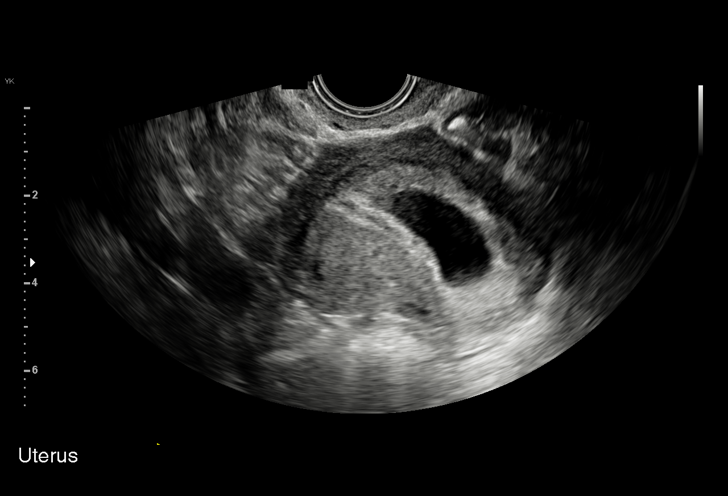
[im 24/46]
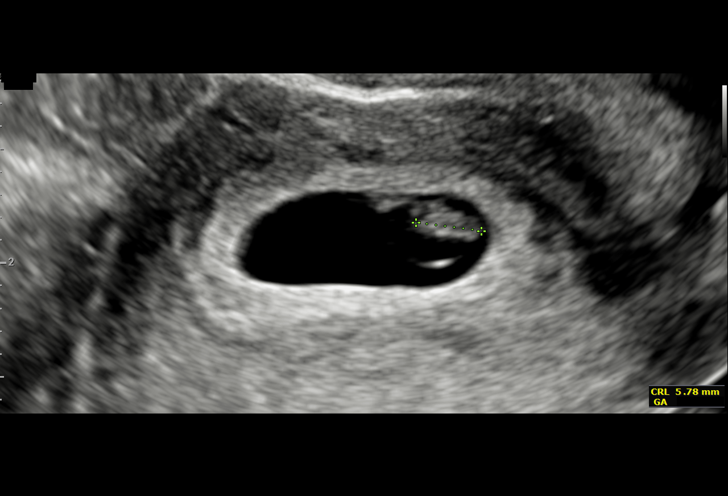
[im 26/46]
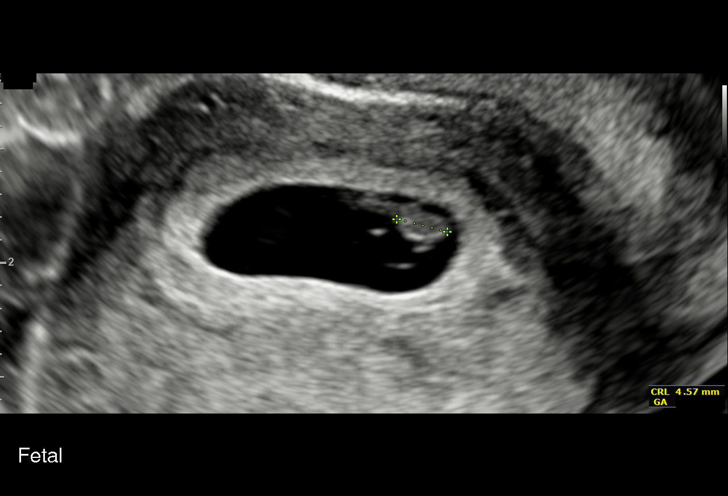
[im 29/46]
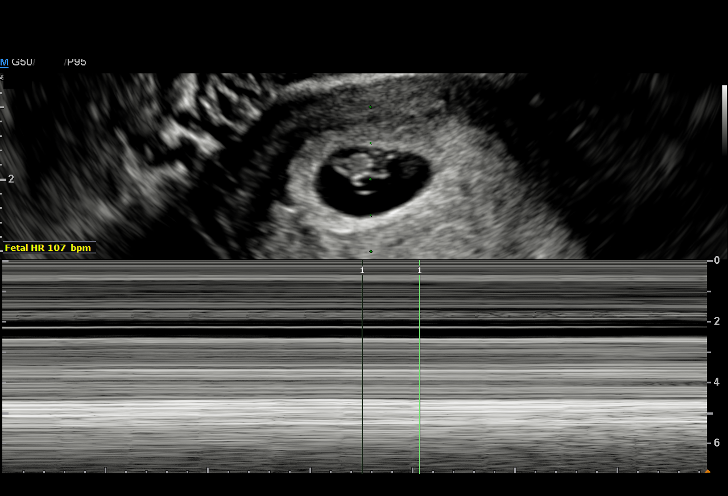
[im 32/46]
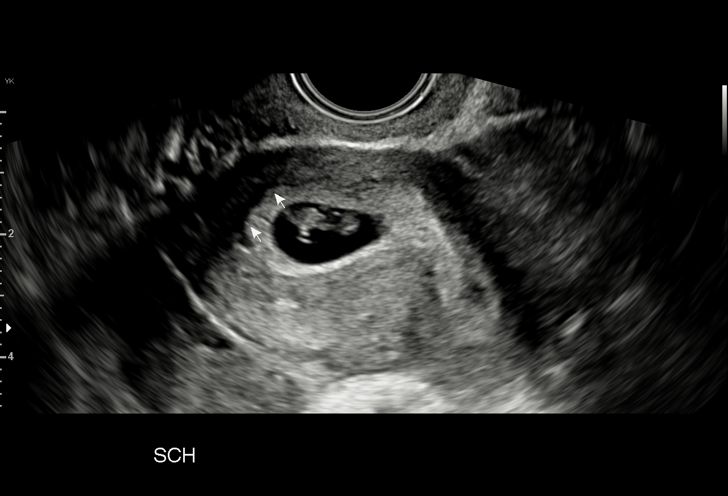
[im 36/46]
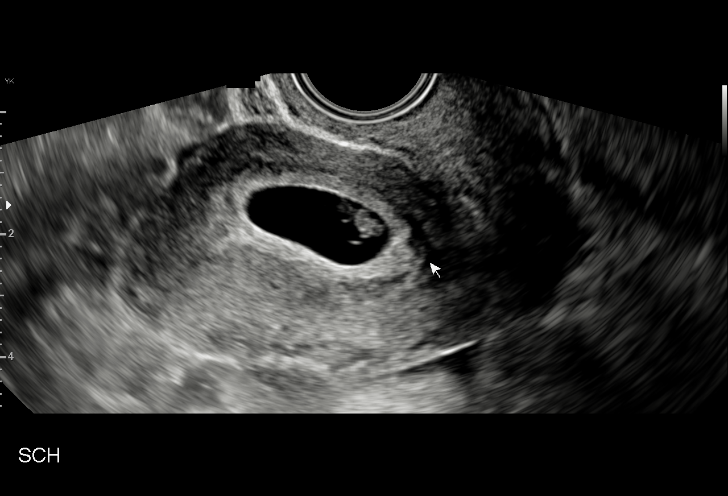
[im 39/46]
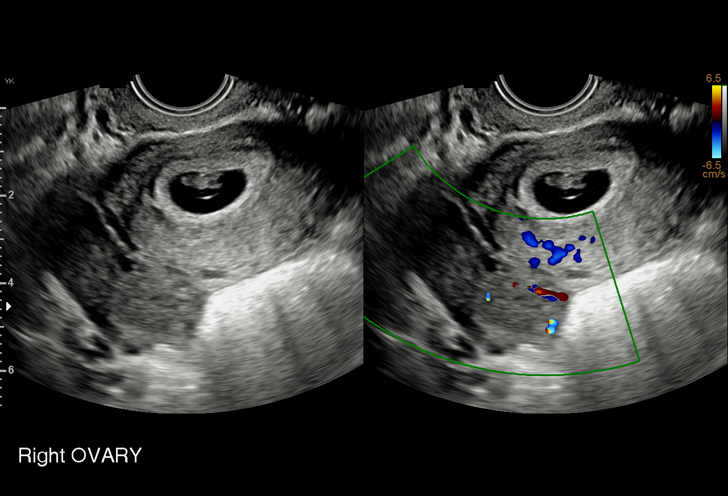
[im 42/46]
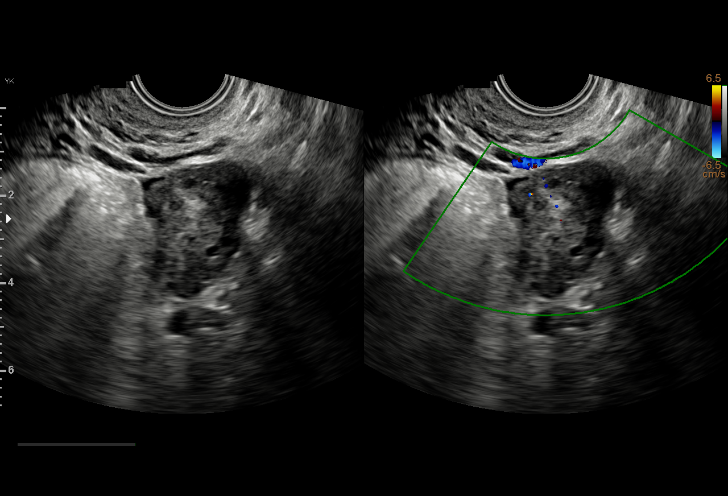
[im 46/46]
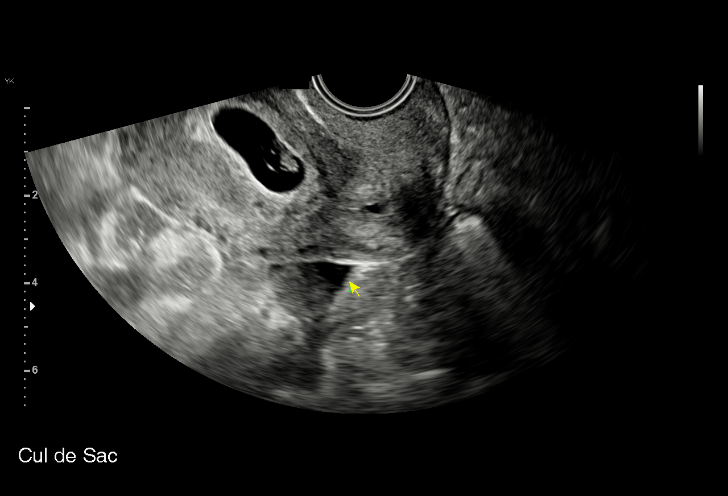

[15 of 28 positions shown; findings below may reference images not displayed]

FINDINGS: LMP: Unknown

GA by 1st U/S: 7 weeks, 4 days

Intrauterine gestational sac: Single

Yolk sac:  Visualized.

Embryo:  Visualized.

Cardiac Activity: Visualized.

Heart Rate: 107 bpm

CRL:   5.24 mm   6 w 1 d                  US EDC: 04/17/2020

Subchorionic hemorrhage:  Small volume subchorionic hemorrhage.

Maternal uterus/adnexae: Corpus luteum cyst noted in the right ovary
measuring 3 x 2.5 x 1.8 cm. Ovaries and maternal uterus are
otherwise unremarkable. Trace anechoic free fluid noted in the deep
pelvis.
IMPRESSION: Single intrauterine gestation with fetal bradycardia and dates
discordant with first gestational ultrasound now measuring only 6
weeks, 1 day. Recommend obstetrical consultation as well as
short-term interval follow-up ultrasound and serial beta HCG.
